# Patient Record
Sex: Male | Born: 1980 | Race: Black or African American | Hispanic: No | Marital: Single | Smoking: Never smoker
Health system: Southern US, Community
[De-identification: ages and names within clinical notes are randomized; demographics above are authoritative.]

## PROBLEM LIST (undated history)

## (undated) DIAGNOSIS — I10 Essential (primary) hypertension: Secondary | ICD-10-CM

## (undated) HISTORY — PX: DUODENOTOMY: SHX5801

## (undated) HISTORY — PX: ABDOMINAL SURGERY: SHX537

## (undated) HISTORY — PX: COSMETIC SURGERY: SHX468

---

## 2007-03-27 ENCOUNTER — Encounter: Admission: RE | Admit: 2007-03-27 | Discharge: 2007-03-27 | Payer: Self-pay | Admitting: Family Medicine

## 2011-06-06 ENCOUNTER — Emergency Department (INDEPENDENT_AMBULATORY_CARE_PROVIDER_SITE_OTHER): Payer: Self-pay

## 2011-06-06 ENCOUNTER — Emergency Department (HOSPITAL_BASED_OUTPATIENT_CLINIC_OR_DEPARTMENT_OTHER)
Admission: EM | Admit: 2011-06-06 | Discharge: 2011-06-06 | Disposition: A | Discharge status: Home / Self Care | Payer: Self-pay | Attending: Emergency Medicine | Admitting: Emergency Medicine

## 2011-06-06 ENCOUNTER — Encounter (HOSPITAL_BASED_OUTPATIENT_CLINIC_OR_DEPARTMENT_OTHER): Payer: Self-pay | Admitting: Emergency Medicine

## 2011-06-06 DIAGNOSIS — R05 Cough: Secondary | ICD-10-CM

## 2011-06-06 DIAGNOSIS — E669 Obesity, unspecified: Secondary | ICD-10-CM | POA: Insufficient documentation

## 2011-06-06 DIAGNOSIS — I517 Cardiomegaly: Secondary | ICD-10-CM

## 2011-06-06 DIAGNOSIS — IMO0001 Reserved for inherently not codable concepts without codable children: Secondary | ICD-10-CM | POA: Insufficient documentation

## 2011-06-06 DIAGNOSIS — R059 Cough, unspecified: Secondary | ICD-10-CM | POA: Insufficient documentation

## 2011-06-06 DIAGNOSIS — J069 Acute upper respiratory infection, unspecified: Secondary | ICD-10-CM | POA: Insufficient documentation

## 2011-06-06 DIAGNOSIS — Z79899 Other long term (current) drug therapy: Secondary | ICD-10-CM | POA: Insufficient documentation

## 2011-06-06 DIAGNOSIS — R609 Edema, unspecified: Secondary | ICD-10-CM | POA: Insufficient documentation

## 2011-06-06 DIAGNOSIS — I10 Essential (primary) hypertension: Secondary | ICD-10-CM | POA: Insufficient documentation

## 2011-06-06 DIAGNOSIS — R062 Wheezing: Secondary | ICD-10-CM | POA: Insufficient documentation

## 2011-06-06 DIAGNOSIS — R0989 Other specified symptoms and signs involving the circulatory and respiratory systems: Secondary | ICD-10-CM

## 2011-06-06 DIAGNOSIS — R0602 Shortness of breath: Secondary | ICD-10-CM | POA: Insufficient documentation

## 2011-06-06 HISTORY — DX: Essential (primary) hypertension: I10

## 2011-06-06 MED ORDER — ALBUTEROL SULFATE HFA 108 (90 BASE) MCG/ACT IN AERS
2.0000 | INHALATION_SPRAY | RESPIRATORY_TRACT | Status: DC | PRN
  Administered 2011-06-06: 2 via RESPIRATORY_TRACT
  Filled 2011-06-06: qty 6.7

## 2011-06-06 MED ORDER — OXYCODONE-ACETAMINOPHEN 5-325 MG PO TABS: 1.0000 | ORAL_TABLET | Freq: Once | ORAL | Status: DC

## 2011-06-06 MED ORDER — HYDROCODONE-ACETAMINOPHEN 5-325 MG PO TABS: 2.0000 | ORAL_TABLET | ORAL | Status: AC | PRN

## 2011-06-06 NOTE — ED Notes (Signed)
Pt c/o nasal and chest congestion x 3 days; Mucinex not helping; started Doxycycline yesterday

## 2011-06-06 NOTE — ED Provider Notes (Signed)
History     CSN: [id number]  Arrival date & time 06/06/11  4098   First MD Initiated Contact with Patient 06/06/11 [phone]      Chief Complaint  Patient presents with  . URI    (Consider location/radiation/quality/duration/timing/severity/associated sxs/prior treatment) Patient is a 31 y.o. male presenting with URI. The history is provided by the patient.  URI The primary symptoms include cough and wheezing. Primary symptoms do not include headaches, abdominal pain, nausea, vomiting or rash.   patient had a cough for the last 3 days. Some mild production. No fevers. He was started on doxycycline by a relative yesterday. He states had some wheezing. He states that he feels is starting to get better. No vomiting. No diarrhea. No headaches. He has had some mild myalgias. No unilateral leg swelling. No recent long distance travel. 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  Past Medical History  Diagnosis Date  . Hypertension     History reviewed. No pertinent past surgical history.  No family history on file.  History  Substance Use Topics  . Smoking status: Never Smoker   . Smokeless tobacco: Not on file  . Alcohol Use: No      Review of Systems  Constitutional: Negative for activity change and appetite change.  HENT: Negative for neck stiffness.   Eyes: Negative for pain.  Respiratory: Positive for cough, shortness of breath and wheezing. Negative for chest tightness.   Cardiovascular: Negative for chest pain and leg swelling.  Gastrointestinal: Negative for nausea, vomiting, abdominal pain and diarrhea.  Genitourinary: Negative for flank pain.  Musculoskeletal: Negative for back pain.  Skin: Negative for rash.  Neurological: Negative for weakness, numbness and headaches.  Psychiatric/Behavioral: Negative for behavioral problems.    Allergies  Review of patient's allergies indicates no known allergies.  Home Medications     Current Outpatient Rx  Name Route Sig Dispense Refill  . AMLODIPINE BESYLATE PO Oral Take by mouth.    Marland Kitchen HYDROCHLOROTHIAZIDE PO Oral Take by mouth.    Marland Kitchen HYDROCODONE-ACETAMINOPHEN 5-325 MG PO TABS Oral Take 2 tablets by mouth every 4 (four) hours as needed (cough). 10 tablet 0    BP 183/86  Pulse 79  Temp(Src) 98.4 F (36.9 C) (Oral)  Resp 28  SpO2 95%  Physical Exam  Nursing note and vitals reviewed. Constitutional: He is oriented to person, place, and time. He appears well-developed and well-nourished.       The patient is obese  HENT:  Head: Normocephalic and atraumatic.  Eyes: EOM are normal. Pupils are equal, round, and reactive to light.  Neck: Normal range of motion. Neck supple.  Cardiovascular: Normal rate, regular rhythm and normal heart sounds.   No murmur heard. Pulmonary/Chest: Effort normal and breath sounds normal.  Abdominal: Soft. Bowel sounds are normal. He exhibits no distension and no mass. There is no tenderness. There is no rebound and no guarding.  Musculoskeletal: Normal range of motion. He exhibits no edema.       Bilateral lower extremity pitting edema  Neurological: He is alert and oriented to person, place, and time. No cranial nerve deficit.  Skin: Skin is warm and dry.  Psychiatric: He has a normal mood and affect.    ED Course  Procedures (including critical care time)  Labs Reviewed - No data to display Dg Chest 2 View  06/06/2011  *RADIOLOGY REPORT*  Clinical Data: Cough, congestion  CHEST - 2 VIEW  Comparison: None.  Findings: Mildly enlarged heart silhouette.  There is a central venous congestion present. There is coarsening of the central bronchovascular markings.  No focal consolidation.  No pneumothorax. No acute osseous abnormality.  IMPRESSION: Cardiomegaly and central venous congestion.  Cannot exclude bronchitis.  Original Report Authenticated By: Genevive Bi, M.D.     1. URI (upper respiratory infection)       MDM  Cough  for the last 3 days. No relief Mucinex. Started on doxycycline by a physician relative. X-ray shows possible bronchitis and mild venous congestion. He has hypertension. He was given an inhaler to help with the symptoms. He'll be given some narcotic medicine to help with the cough. He'll followup as needed. He is going to continue to antibiotic that was started by his relative        Juliet Rude. Rubin Payor, MD 06/06/11 1054

## 2011-07-25 ENCOUNTER — Emergency Department (INDEPENDENT_AMBULATORY_CARE_PROVIDER_SITE_OTHER): Payer: No Typology Code available for payment source

## 2011-07-25 ENCOUNTER — Emergency Department (HOSPITAL_BASED_OUTPATIENT_CLINIC_OR_DEPARTMENT_OTHER)
Admission: EM | Admit: 2011-07-25 | Discharge: 2011-07-25 | Disposition: A | Discharge status: Home / Self Care | Payer: No Typology Code available for payment source | Attending: Emergency Medicine | Admitting: Emergency Medicine

## 2011-07-25 ENCOUNTER — Encounter (HOSPITAL_BASED_OUTPATIENT_CLINIC_OR_DEPARTMENT_OTHER): Payer: Self-pay | Admitting: *Deleted

## 2011-07-25 DIAGNOSIS — S60222A Contusion of left hand, initial encounter: Secondary | ICD-10-CM

## 2011-07-25 DIAGNOSIS — M7989 Other specified soft tissue disorders: Secondary | ICD-10-CM

## 2011-07-25 DIAGNOSIS — M79609 Pain in unspecified limb: Secondary | ICD-10-CM

## 2011-07-25 DIAGNOSIS — I10 Essential (primary) hypertension: Secondary | ICD-10-CM | POA: Insufficient documentation

## 2011-07-25 MED ORDER — HYDROCODONE-ACETAMINOPHEN 5-325 MG PO TABS: 2.0000 | ORAL_TABLET | ORAL | Status: AC | PRN

## 2011-07-25 MED ORDER — IBUPROFEN 800 MG PO TABS: 800.0000 mg | ORAL_TABLET | Freq: Three times a day (TID) | ORAL | Status: AC

## 2011-07-25 MED ORDER — IBUPROFEN 800 MG PO TABS: 800.0000 mg | ORAL_TABLET | Freq: Three times a day (TID) | ORAL | Status: DC
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
## 2011-07-25 NOTE — Discharge Instructions (Signed)
Contusion  A contusion is a deep bruise. Contusions happen when an injury causes bleeding under the skin. Signs of bruising include pain, puffiness (swelling), and discolored skin. The contusion may turn blue, purple, or yellow.  HOME CARE    Put ice on the injured area.   Put ice in a plastic bag.   Place a towel between your skin and the bag.   Leave the ice on for 15 to 20 minutes, 3 to 4 times a day.   Only take medicine as told by your doctor.   Rest the injured area.   If possible, raise (elevate) the injured area to lessen puffiness.  GET HELP RIGHT AWAY IF:    You have more bruising or puffiness.   You have pain that is getting worse.   Your puffiness or pain is not helped by medicine.  MAKE SURE YOU:    Understand these instructions.   Will watch your condition.   Will get help right away if you are not doing well or get worse.  Document Released: 09/08/2007 Document Revised: 03/11/2011 Document Reviewed: 01/25/2011  Exit[employer] Patient Information 2012 [employer].
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
## 2011-07-25 NOTE — ED Notes (Signed)
Upon entering room to complete discharge, there were 5 family members in the room, pt was asked to come to nurses station to sign for discharge paperwork as I could not get to the computer terminal.

## 2011-07-29 NOTE — ED Provider Notes (Signed)
Medical screening examination/treatment/procedure(s) were performed by non-physician practitioner and as supervising physician I was immediately available for consultation/collaboration.  Teyon Odette K Linker, MD 07/29/11 1119 

## 2012-11-03 ENCOUNTER — Encounter (HOSPITAL_COMMUNITY): Payer: Self-pay

## 2012-11-03 ENCOUNTER — Emergency Department (HOSPITAL_COMMUNITY)
Admission: EM | Admit: 2012-11-03 | Discharge: 2012-11-03 | Disposition: A | Discharge status: Home / Self Care | Payer: BC Managed Care – PPO | Attending: Emergency Medicine | Admitting: Emergency Medicine

## 2012-11-03 DIAGNOSIS — R51 Headache: Secondary | ICD-10-CM | POA: Insufficient documentation

## 2012-11-03 DIAGNOSIS — Z880 Allergy status to penicillin: Secondary | ICD-10-CM | POA: Insufficient documentation

## 2012-11-03 DIAGNOSIS — E669 Obesity, unspecified: Secondary | ICD-10-CM | POA: Insufficient documentation

## 2012-11-03 DIAGNOSIS — R002 Palpitations: Secondary | ICD-10-CM | POA: Insufficient documentation

## 2012-11-03 DIAGNOSIS — G252 Other specified forms of tremor: Secondary | ICD-10-CM | POA: Insufficient documentation

## 2012-11-03 DIAGNOSIS — R519 Headache, unspecified: Secondary | ICD-10-CM

## 2012-11-03 DIAGNOSIS — G250 Essential tremor: Secondary | ICD-10-CM | POA: Insufficient documentation

## 2012-11-03 DIAGNOSIS — E876 Hypokalemia: Secondary | ICD-10-CM

## 2012-11-03 DIAGNOSIS — Z79899 Other long term (current) drug therapy: Secondary | ICD-10-CM | POA: Insufficient documentation

## 2012-11-03 LAB — POCT I-STAT, CHEM 8
BUN: 12 mg/dL (ref 6–23)
Chloride: 101 mEq/L (ref 96–112)
Creatinine, Ser: 0.9 mg/dL (ref 0.50–1.35)
Potassium: 3.3 mEq/L — ABNORMAL LOW (ref 3.5–5.1)

## 2012-11-03 NOTE — ED Provider Notes (Signed)
CSN: [id number]     Arrival date & time 11/03/12  9629 History     First MD Initiated Contact with Patient 11/03/12 0243     Chief Complaint  Patient presents with  . Hypertension   HPI  History provided by the patient. Patient is a 32 year old male with history of hypertension and obesity who presents with complaints of not feeling well, jitteriness and slight headache. Patient states he was trying to fall sleep was having a difficult time and feeling of pressure in his head as well as feeling shaky and jittery and his body and extremities. Patient was concerned he may be having blood pressure issues. He does report taking a potassium supplement around 10:30 PM. Patient previously was on potassium regularly in conjunction with his hydrochlorothiazide blood pressure medicine but states he had not taken any potassium for quite some time and felt like taking it this evening. He otherwise felt well during the day. There was nothing else new or changed this evening. Denies any alcohol or drug use. Denies any excessive caffeine use. Symptoms are also associated with a pounding of the heart and slight palpitation. Denies any chest pain or shortness of breath. Symptoms are since improving. No other aggravating or alleviating factors. No other associated symptoms.    Past Medical History  Diagnosis Date  . Hypertension    History reviewed. No pertinent past surgical history. History reviewed. No pertinent family history. History  Substance Use Topics  . Smoking status: Never Smoker   . Smokeless tobacco: Not on file  . Alcohol Use: No    Review of Systems  Constitutional: Negative for fever, chills, diaphoresis and fatigue.  Eyes: Negative for visual disturbance.  Respiratory: Negative for shortness of breath.   Cardiovascular: Positive for palpitations. Negative for chest pain.  Gastrointestinal: Negative for nausea.  Neurological: Positive for tremors and headaches. Negative for weakness  and numbness.  Psychiatric/Behavioral: Negative for confusion.  All other systems reviewed and are negative.    Allergies  Penicillins  Home Medications   Current Outpatient Rx  Name  Route  Sig  Dispense  Refill  . albuterol (PROVENTIL HFA;VENTOLIN HFA) 108 (90 BASE) MCG/ACT inhaler   Inhalation   Inhale 2 puffs into the lungs every 6 (six) hours as needed. For shortness of breath or wheezing         . hydrochlorothiazide (HYDRODIURIL) 25 MG tablet   Oral   Take 25 mg by mouth daily.         Marland Kitchen olmesartan (BENICAR) 20 MG tablet   Oral   Take 20 mg by mouth daily.          BP 108/69  Pulse 65  Temp(Src) 98.7 F (37.1 C) (Oral)  Resp 18  Ht 5\' 10"  (1.778 m)  Wt 430 lb (195.047 kg)  BMI 61.7 kg/m2  SpO2 98% Physical Exam  Nursing note and vitals reviewed. Constitutional: He is oriented to person, place, and time. He appears well-developed and well-nourished.  Morbidly obese  HENT:  Head: Normocephalic.  Eyes: Conjunctivae and EOM are normal. Pupils are equal, round, and reactive to light.  No nystagmus  Cardiovascular: Normal rate and regular rhythm.   No murmur heard. Pulmonary/Chest: Effort normal and breath sounds normal. No respiratory distress. He has no wheezes. He has no rales.  Abdominal: Soft.  Musculoskeletal: Normal range of motion. He exhibits no edema and no tenderness.  Neurological: He is alert and oriented to person, place, and time. He has normal strength.  No cranial nerve deficit or sensory deficit. Gait normal.  Skin: Skin is warm.  Psychiatric: He has a normal mood and affect. His behavior is normal.    ED Course   Procedures   Results for orders placed during the hospital encounter of 11/03/12  POCT I-STAT, CHEM 8      Result Value Range   Sodium 140  135 - 145 mEq/L   Potassium 3.3 (*) 3.5 - 5.1 mEq/L   Chloride 101  96 - 112 mEq/L   BUN 12  6 - 23 mg/dL   Creatinine, Ser 1.61  0.50 - 1.35 mg/dL   Glucose, Bld 096 (*) 70 - 99  mg/dL   Calcium, Ion 0.45  4.09 - 1.23 mmol/L   TCO2 27  0 - 100 mmol/L   Hemoglobin 14.6  13.0 - 17.0 g/dL   HCT 81.1  91.4 - 78.2 %        1. Hypokalemia   2. Headache       MDM  Patient seen and evaluated. Patient appears well in no acute distress. Reports symptoms are improving.  Patient continues to be improved. He is sleepy this morning but has no other complaints. No return of symptoms or any new symptoms. ECG without any acute concerning findings. Labs with very slight hypokalemia. Otherwise unremarkable. At this time patient is felt able to return home continue to monitor symptoms and followup with PCP.    Date: 11/03/2012  Rate: 63  Rhythm: normal sinus rhythm  QRS Axis: normal  Intervals: normal  ST/T Wave abnormalities: nonspecific T wave changes  Conduction Disutrbances:none  Narrative Interpretation:   Old EKG Reviewed: none available    Angus Seller, PA-C 11/03/12 0502

## 2012-11-03 NOTE — ED Notes (Signed)
Patient is alert and oriented x3.  He was given DC instructions and follow up visit instructions.  Patient gave verbal understanding.  He was DC ambulatory under his own power to home.  V/S stable.  He was not showing any signs of distress on DC 

## 2012-11-03 NOTE — ED Notes (Signed)
Pt is concerned that his blood pressure was elevated, he took a potassium supplement and then had trouble going to sleep, he states that it felt like his heart rate was elevated and he felt pressure in his head

## 2012-11-03 NOTE — ED Provider Notes (Signed)
Medical screening examination/treatment/procedure(s) were performed by non-physician practitioner and as supervising physician I was immediately available for consultation/collaboration.  Lyanne Co, MD 11/03/12 [phone]

## 2013-09-13 ENCOUNTER — Encounter (HOSPITAL_BASED_OUTPATIENT_CLINIC_OR_DEPARTMENT_OTHER): Payer: Self-pay | Admitting: Emergency Medicine

## 2013-09-13 ENCOUNTER — Emergency Department (HOSPITAL_BASED_OUTPATIENT_CLINIC_OR_DEPARTMENT_OTHER)
Admission: EM | Admit: 2013-09-13 | Discharge: 2013-09-13 | Disposition: A | Discharge status: Home / Self Care | Payer: BC Managed Care – PPO | Attending: Emergency Medicine | Admitting: Emergency Medicine

## 2013-09-13 DIAGNOSIS — I10 Essential (primary) hypertension: Secondary | ICD-10-CM | POA: Insufficient documentation

## 2013-09-13 DIAGNOSIS — H1132 Conjunctival hemorrhage, left eye: Secondary | ICD-10-CM

## 2013-09-13 DIAGNOSIS — Z880 Allergy status to penicillin: Secondary | ICD-10-CM | POA: Insufficient documentation

## 2013-09-13 DIAGNOSIS — H1130 Conjunctival hemorrhage, unspecified eye: Secondary | ICD-10-CM | POA: Insufficient documentation

## 2013-09-13 DIAGNOSIS — Z79899 Other long term (current) drug therapy: Secondary | ICD-10-CM | POA: Insufficient documentation

## 2013-09-13 MED ORDER — FLUORESCEIN SODIUM 1 MG OP STRP
ORAL_STRIP | OPHTHALMIC | Status: AC
  Filled 2013-09-13: qty 1

## 2013-09-13 MED ORDER — FLUORESCEIN SODIUM 1 MG OP STRP: 1.0000 | ORAL_STRIP | Freq: Once | OPHTHALMIC | Status: DC

## 2013-09-13 MED ORDER — TETRACAINE HCL 0.5 % OP SOLN: 1.0000 [drp] | Freq: Once | OPHTHALMIC | Status: DC

## 2013-09-13 MED ORDER — TETRACAINE HCL 0.5 % OP SOLN
OPHTHALMIC | Status: AC
  Filled 2013-09-13: qty 2

## 2013-09-13 MED ORDER — POLYMYXIN B-TRIMETHOPRIM 10000-0.1 UNIT/ML-% OP SOLN: 1.0000 [drp] | OPHTHALMIC | Status: DC

## 2013-09-13 NOTE — Discharge Instructions (Signed)
Subconjunctival Hemorrhage °A subconjunctival hemorrhage is a bright red patch covering a portion of the white of the eye. The white part of the eye is called the sclera, and it is covered by a thin membrane called the conjunctiva. This membrane is clear, except for tiny blood vessels that you can see with the naked eye. When your eye is irritated or inflamed and becomes red, it is because the vessels in the conjunctiva are swollen. °Sometimes, a blood vessel in the conjunctiva can break and bleed. When this occurs, the blood builds up between the conjunctiva and the sclera, and spreads out to create a red area. The red spot may be very small at first. It may then spread to cover a larger part of the surface of the eye, or even all of the visible white part of the eye. °In almost all cases, the blood will go away and the eye will become white again. Before completely dissolving, however, the red area may spread. It may also become brownish-yellow in color, before going away. If a lot of blood collects under the conjunctiva, it may look like a bulge on the surface of the eye. This looks scary, but it will also eventually flatten out and go away. Subconjunctival hemorrhages do not cause pain, but if swollen, may cause a feeling of irritation. There is no effect on vision.  °CAUSES  °· The most common cause is mild trauma (rubbing the eye, irritation). °· Subconjunctival hemorrhages can happen because of coughing or straining (lifting heavy objects), vomiting, or sneezing. °· In some cases, your doctor may want to check your blood pressure. High blood pressure can also cause a sunconjunctival hemorrhage. °· Severe trauma or blunt injuries. °· Diseases that affect blood clotting (hemophilia, leukemia). °· Abnormalities of blood vessels behind the eye (carotid cavernous sinus fistula). °· Tumors behind the eye. °· Certain drugs (aspirin, coumadin, heparin). °· Recent eye surgery. °HOME CARE INSTRUCTIONS  °· Do not worry  about the appearance of your eye. You may continue your usual activities. °· Often, follow-up is not necessary. °SEEK MEDICAL CARE IF:  °· Your eye becomes painful. °· The bleeding does not disappear within 3 weeks. °· Bleeding occurs elsewhere, for example, under the skin, in the mouth, or in the other eye. °· You have recurring subconjunctival hemorrhages. °SEEK IMMEDIATE MEDICAL CARE IF:  °· Your vision changes or you have difficulty seeing. °· You develop severe headache, persistent vomiting, confusion, or abnormal drowsiness (lethargy). °· Your eye seems to bulge or protrude from the eye socket. °· You notice the sudden appearance of bruises, or have spontaneous bleeding elsewhere on your body. °Document Released: 03/22/2005 Document Revised: 06/14/2011 Document Reviewed: 02/17/2009 °Exit[employer]® Patient Information ©2014 [employer]. ° °

## 2013-09-13 NOTE — ED Notes (Signed)
Pt amb to room 6 with quick steady gait in nad. Pt reports "something flew into my eye last night while I was out taking a walk..." pt states he rubbed his eye, has had burning, blurred vision and redness since.

## 2013-09-13 NOTE — ED Provider Notes (Signed)
CSN: [id number]     Arrival date & time 09/13/13  9758 History   First MD Initiated Contact with Patient 09/13/13 1014     Chief Complaint  Patient presents with  . Eye Problem     (Consider location/radiation/quality/duration/timing/severity/associated sxs/prior Treatment) Patient is a 33 y.o. male presenting with eye problem. The history is provided by the patient.  Eye Problem Location:  L eye Quality:  Tearing Severity:  Moderate Onset quality:  Sudden Duration:  12 hours Timing:  Constant Progression:  Unchanged Chronicity:  New Context comment:  Was walking and a bug flew in his eye.  it was immediately red but some swelling today with watering.  minimal pain Relieved by:  None tried Worsened by:  Nothing tried Ineffective treatments:  None tried Associated symptoms: redness, swelling and tearing   Associated symptoms: no blurred vision, no crusting and no decreased vision     Past Medical History  Diagnosis Date  . Hypertension    History reviewed. No pertinent past surgical history. History reviewed. No pertinent family history. History  Substance Use Topics  . Smoking status: Never Smoker   . Smokeless tobacco: Not on file  . Alcohol Use: No    Review of Systems  Eyes: Positive for redness. Negative for blurred vision.  All other systems reviewed and are negative.     Allergies  Penicillins  Home Medications   Prior to Admission medications   Medication Sig Start Date End Date Taking? Authorizing Provider  albuterol (PROVENTIL HFA;VENTOLIN HFA) 108 (90 BASE) MCG/ACT inhaler Inhale 2 puffs into the lungs every 6 (six) hours as needed. For shortness of breath or wheezing    Historical Provider, MD  lisinopril-hydrochlorothiazide (PRINZIDE,ZESTORETIC) 20-25 MG per tablet Take 1 tablet by mouth daily.    Historical Provider, MD   BP 135/87  Pulse 77  Temp(Src) 99.4 F (37.4 C) (Oral)  Resp 16  Ht 5\' 10"  (1.778 m)  Wt 400 lb (181.439 kg)  BMI 57.39  kg/m2  SpO2 96% Physical Exam  Nursing note and vitals reviewed. Constitutional: He is oriented to person, place, and time. He appears well-developed and well-nourished. No distress.  HENT:  Head: Normocephalic and atraumatic.  Mouth/Throat: Oropharynx is clear and moist.  Eyes: EOM are normal. Pupils are equal, round, and reactive to light. Left conjunctiva has a hemorrhage.  Mild left upper lid swelling and complete subconjunctival hemorrhage  Neck: Normal range of motion. Neck supple.  Cardiovascular: Normal rate.   Pulmonary/Chest: Effort normal.  Abdominal: Soft. He exhibits no distension. There is no tenderness. There is no rebound and no guarding.  Musculoskeletal: Normal range of motion. He exhibits no edema and no tenderness.  Neurological: He is alert and oriented to person, place, and time.  Skin: Skin is warm and dry. No rash noted. No erythema.  Psychiatric: He has a normal mood and affect. His behavior is normal.    ED Course  Procedures (including critical care time) Labs Review Labs Reviewed - No data to display  Imaging Review No results found.   EKG Interpretation None      MDM   Final diagnoses:  Subconjunctival hemorrhage of left eye    Patient hit in the left eye by a bug while walking last night with evidence of left subconjunctival hemorrhage without vision changes or evidence of corneal abrasion.    Gwyneth Sprout, MD 09/13/13 1109

## 2014-09-06 ENCOUNTER — Other Ambulatory Visit: Payer: Self-pay | Admitting: Surgical Oncology

## 2014-09-06 DIAGNOSIS — K219 Gastro-esophageal reflux disease without esophagitis: Secondary | ICD-10-CM

## 2016-02-17 ENCOUNTER — Emergency Department (HOSPITAL_COMMUNITY)
Admission: EM | Admit: 2016-02-17 | Discharge: 2016-02-17 | Disposition: A | Discharge status: Home / Self Care | Payer: Self-pay | Attending: Emergency Medicine | Admitting: Emergency Medicine

## 2016-02-17 ENCOUNTER — Encounter (HOSPITAL_COMMUNITY): Payer: Self-pay

## 2016-02-17 ENCOUNTER — Emergency Department (HOSPITAL_COMMUNITY): Payer: Self-pay

## 2016-02-17 DIAGNOSIS — R1013 Epigastric pain: Secondary | ICD-10-CM

## 2016-02-17 DIAGNOSIS — I10 Essential (primary) hypertension: Secondary | ICD-10-CM | POA: Insufficient documentation

## 2016-02-17 DIAGNOSIS — Z79899 Other long term (current) drug therapy: Secondary | ICD-10-CM | POA: Insufficient documentation

## 2016-02-17 DIAGNOSIS — K5900 Constipation, unspecified: Secondary | ICD-10-CM | POA: Insufficient documentation

## 2016-02-17 DIAGNOSIS — R1033 Periumbilical pain: Secondary | ICD-10-CM | POA: Insufficient documentation

## 2016-02-17 LAB — URINALYSIS, ROUTINE W REFLEX MICROSCOPIC
Bilirubin Urine: NEGATIVE
GLUCOSE, UA: NEGATIVE mg/dL
HGB URINE DIPSTICK: NEGATIVE
KETONES UR: 15 mg/dL — AB
Leukocytes, UA: NEGATIVE
Nitrite: NEGATIVE
PH: 6 (ref 5.0–8.0)
PROTEIN: NEGATIVE mg/dL
Specific Gravity, Urine: 1.025 (ref 1.005–1.030)

## 2016-02-17 LAB — COMPREHENSIVE METABOLIC PANEL
ALBUMIN: 4.8 g/dL (ref 3.5–5.0)
ALT: 67 U/L — AB (ref 17–63)
AST: 33 U/L (ref 15–41)
Alkaline Phosphatase: 60 U/L (ref 38–126)
Anion gap: 8 (ref 5–15)
BUN: 11 mg/dL (ref 6–20)
CHLORIDE: 106 mmol/L (ref 101–111)
CO2: 26 mmol/L (ref 22–32)
CREATININE: 0.79 mg/dL (ref 0.61–1.24)
Calcium: 9.4 mg/dL (ref 8.9–10.3)
GFR calc Af Amer: >60 mL/min (ref >60)
GLUCOSE: 96 mg/dL (ref 65–99)
Potassium: 3.3 mmol/L — ABNORMAL LOW (ref 3.5–5.1)
Sodium: 140 mmol/L (ref 135–145)
Total Bilirubin: 0.8 mg/dL (ref 0.3–1.2)
Total Protein: 8.1 g/dL (ref 6.5–8.1)

## 2016-02-17 LAB — CBC WITH DIFFERENTIAL/PLATELET
Basophils Absolute: 0 10*3/uL (ref 0.0–0.1)
Basophils Relative: 1 %
EOS PCT: 1 %
Eosinophils Absolute: 0.1 10*3/uL (ref 0.0–0.7)
HEMATOCRIT: 49.7 % (ref 39.0–52.0)
Hemoglobin: 16.5 g/dL (ref 13.0–17.0)
LYMPHS ABS: 1.3 10*3/uL (ref 0.7–4.0)
LYMPHS PCT: 29 %
MCH: 30.3 pg (ref 26.0–34.0)
MCHC: 33.2 g/dL (ref 30.0–36.0)
MCV: 91.4 fL (ref 78.0–100.0)
MONO ABS: 0.3 10*3/uL (ref 0.1–1.0)
MONOS PCT: 7 %
Neutro Abs: 2.7 10*3/uL (ref 1.7–7.7)
Neutrophils Relative %: 62 %
PLATELETS: 233 10*3/uL (ref 150–400)
RBC: 5.44 MIL/uL (ref 4.22–5.81)
RDW: 13.8 % (ref 11.5–15.5)
WBC: 4.3 10*3/uL (ref 4.0–10.5)

## 2016-02-17 LAB — I-STAT CG4 LACTIC ACID, ED: Lactic Acid, Venous: 0.76 mmol/L (ref 0.5–1.9)

## 2016-02-17 LAB — LIPASE, BLOOD: LIPASE: 18 U/L (ref 11–51)

## 2016-02-17 MED ORDER — IOPAMIDOL (ISOVUE-300) INJECTION 61%
100.0000 mL | Freq: Once | INTRAVENOUS | Status: AC | PRN
  Administered 2016-02-17: 100 mL via INTRAVENOUS

## 2016-02-17 MED ORDER — MORPHINE SULFATE (PF) 4 MG/ML IV SOLN
4.0000 mg | Freq: Once | INTRAVENOUS | Status: AC
  Administered 2016-02-17: 4 mg via INTRAVENOUS
  Filled 2016-02-17: qty 1

## 2016-02-17 MED ORDER — ONDANSETRON HCL 4 MG/2ML IJ SOLN
4.0000 mg | Freq: Once | INTRAMUSCULAR | Status: AC
  Administered 2016-02-17: 4 mg via INTRAVENOUS
  Filled 2016-02-17: qty 2

## 2016-02-17 MED ORDER — POLYETHYLENE GLYCOL 3350 17 GM/SCOOP PO POWD: 17.0000 g | Freq: Two times a day (BID) | ORAL | 0 refills | Status: AC

## 2016-02-17 MED ORDER — SODIUM CHLORIDE 0.9 % IV BOLUS (SEPSIS)
1000.0000 mL | Freq: Once | INTRAVENOUS | Status: AC
  Administered 2016-02-17: 1000 mL via INTRAVENOUS

## 2016-02-17 MED ORDER — OMEPRAZOLE 20 MG PO CPDR: 20.0000 mg | DELAYED_RELEASE_CAPSULE | Freq: Every day | ORAL | 0 refills | Status: AC

## 2016-02-17 NOTE — ED Notes (Signed)
Patient transported to CT 

## 2016-02-17 NOTE — ED Triage Notes (Signed)
Pt complains of severe abdominal pain for the last hour, he had gastric surgery one year ago and he says that he ate chili last night and thinks the beans were too much, no vomiting but he's had diarrhea a few times

## 2016-02-17 NOTE — Progress Notes (Signed)
CM spoke with pt who confirms uninsured [place] resident with no pcp.  CM discussed and provided written information to assist pt with determining choice for uninsured accepting pcps, discussed the importance of pcp vs EDP services for f/u care, www.needymeds.org, www.goodrx.com, discounted pharmacies and other [place] resources such as [facility] , [facility], affordable care act, financial assistance, uninsured dental services, [facility] [facility], DSS and  health department  Reviewed resources for [place] uninsured accepting pcps like [facility], [facility] [address], [facility], [facility], local urgent care centers, [facility], [facility], general medical clinics, [facility], [facility] plus others, medication resources, [facility] out [facility]ies and housing Pt voiced understanding and appreciation of resources provided   Provided [facility] contact information Pt seen by Stacey of [facility] prior to leaving [facility] ED   

## 2016-02-17 NOTE — ED Provider Notes (Signed)
[facility]-EMERGENCY DEPT Provider Note   CSN: [id number] Arrival date & time: 02/17/16  [id number]     History   Chief Complaint Chief Complaint  Patient presents with  . Abdominal Pain    HPI Brett Cannon is a 35 y.o. male.  Brett Cannon is a 35 y.o. Male with a history of a duodenal switch who presents to the emergency department complaining of gradual onset of epigastric abdominal pain that was severe beginning this morning. Patient reports he ate chili last night and had some gas pains. He reports gradual onset this morning of epigastric abdominal pain that was severe enough to bring him to the emergency department. He reports having 2 bowel movements this morning that were formed. No diarrhea. He reports nausea but no vomiting today. He has a history of a duodenal switch for morbid obesity by a surgeon at [facility]. No treatments prior to arrival today. Patient denies fevers, chest pain, shortness of breath, coughing, vomiting, diarrhea, hematochezia, urinary symptoms or rashes.   The history is provided by the patient. No language interpreter was used.    Past Medical History:  Diagnosis Date  . Hypertension     There are no active problems to display for this patient.   Past Surgical History:  Procedure Laterality Date  . DUODENOTOMY         Home Medications    Prior to Admission medications   Medication Sig Start Date End Date Taking? Authorizing Provider  lisinopril (PRINIVIL,ZESTRIL) 5 MG tablet Take 5 mg by mouth daily.   Yes Historical Provider, MD  Multiple Vitamins-Minerals (MULTIVITAMIN ADULT) TABS Take 1 tablet by mouth daily.   Yes Historical Provider, MD  omeprazole (PRILOSEC) 20 MG capsule Take 1 capsule (20 mg total) by mouth daily. 02/17/16   Everlene FarrierWilliam Luisdaniel Kenton, PA-C  polyethylene glycol powder (GLYCOLAX/MIRALAX) powder Take 17 g by mouth 2 (two) times daily. 02/17/16   Everlene FarrierWilliam Ajee Heasley, PA-C    Family History History reviewed. No pertinent family  history.  Social History Social History  Substance Use Topics  . Smoking status: Never Smoker  . Smokeless tobacco: Never Used  . Alcohol use No     Allergies   Penicillins and Pineapple   Review of Systems Review of Systems  Constitutional: Negative for chills and fever.  HENT: Negative for congestion and sore throat.   Eyes: Negative for visual disturbance.  Respiratory: Negative for cough and shortness of breath.   Cardiovascular: Negative for chest pain.  Gastrointestinal: Positive for abdominal pain and nausea. Negative for diarrhea and vomiting.  Genitourinary: Negative for difficulty urinating, dysuria, frequency, hematuria and urgency.  Musculoskeletal: Negative for back pain and neck pain.  Skin: Negative for rash.  Neurological: Negative for headaches.     Physical Exam Updated Vital Signs BP 120/74 (BP Location: Left Arm)   Pulse 72   Temp 97.6 F (36.4 C) (Oral)   Resp 18   Ht 5\' 10"  (1.778 m)   Wt 113.4 kg   SpO2 100%   BMI 35.87 kg/m   Physical Exam  Constitutional: He appears well-developed and well-nourished. No distress.  Nontoxic-appearing. Morbidly obese.  HENT:  Head: Normocephalic and atraumatic.  Mouth/Throat: Oropharynx is clear and moist.  Eyes: Conjunctivae are normal. Pupils are equal, round, and reactive to light. Right eye exhibits no discharge. Left eye exhibits no discharge.  Neck: Neck supple.  Cardiovascular: Normal rate, regular rhythm, normal heart sounds and intact distal pulses.  Exam reveals no gallop and no friction rub.  No murmur heard. Pulmonary/Chest: Effort normal and breath sounds normal. No respiratory distress.  Abdominal: Soft. Bowel sounds are normal. He exhibits no distension and no mass. There is tenderness. There is guarding.  Abdomen is soft. Bowel sounds are present. Patient has epigastric abdominal tenderness to palpation with guarding. No lower abdominal tenderness.  Musculoskeletal: He exhibits no edema.    Lymphadenopathy:    He has no cervical adenopathy.  Neurological: He is alert. Coordination normal.  Skin: Skin is warm and dry. Capillary refill takes less than 2 seconds. No rash noted. He is not diaphoretic. No erythema. No pallor.  Psychiatric: He has a normal mood and affect. His behavior is normal.  Nursing note and vitals reviewed.    ED Treatments / Results  Labs (all labs ordered are listed, but only abnormal results are displayed) Labs Reviewed  COMPREHENSIVE METABOLIC PANEL - Abnormal; Notable for the following:       Result Value   Potassium 3.3 (*)    ALT 67 (*)    All other components within normal limits  URINALYSIS, ROUTINE W REFLEX MICROSCOPIC (NOT AT [facility]) - Abnormal; Notable for the following:    APPearance CLOUDY (*)    Ketones, ur 15 (*)    All other components within normal limits  LIPASE, BLOOD  CBC WITH DIFFERENTIAL/PLATELET  I-STAT CG4 LACTIC ACID, ED    EKG  EKG Interpretation None       Radiology Ct Abdomen Pelvis W Contrast  Result Date: 02/17/2016 CLINICAL DATA:  Acute epigastric abdominal pain. History of prior gastroduodenal surgery. EXAM: CT ABDOMEN AND PELVIS WITH CONTRAST TECHNIQUE: Multidetector CT imaging of the abdomen and pelvis was performed using the standard protocol following bolus administration of intravenous contrast. CONTRAST:  ISOVUE-300 IOPAMIDOL (ISOVUE-300) INJECTION 61% COMPARISON:  None. FINDINGS: Lower chest: The lung bases are clear of acute process. No pleural effusion or pulmonary lesions. The heart is normal in size. No pericardial effusion. The distal esophagus and aorta are unremarkable. Hepatobiliary: No focal hepatic lesions or intrahepatic biliary dilatation. The portal vein is patent. Gallbladder is unremarkable. No common bile duct dilatation. Pancreas: No mass, inflammation or ductal dilatation. Spleen: Normal size.  No focal lesions. Adrenals/Urinary Tract: The adrenal glands and kidneys are unremarkable.  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 No complicating features are demonstrated. The distal small bowel is slightly dilated and has the stool appearance. This could be related to the patient's bypass surgery. I do not see a definite small bowel obstruction. There is moderate stool throughout the colon and down into the rectum. The terminal ileum is unremarkable. The appendix is normal. Vascular/Lymphatic: The aorta and branch vessels are patent. The major venous structures are patent. Prominent vessels in the root of the small bowel mesenteric a but no findings for venous thrombosis. Small scattered mesenteric and retroperitoneal lymph nodes but no mass or adenopathy. Mild diffuse mesenteric edema is noted along with small amount of free pelvic fluid. There is also diffuse body wall edema. Reproductive: The prostate gland and seminal vesicles are unremarkable. Other: No pelvic mass or adenopathy. No free pelvic fluid collections. No inguinal mass or adenopathy. No inguinal hernia. Musculoskeletal: No significant bony findings. IMPRESSION: 1. 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 No complicating features are identified. 2. Mildly dilated distal small bowel containing stool. However, no findings to suggest obstruction. This could be a function of the patient's bowel surgery. 3. Mesenteric edema, diffuse body wall edema and a small amount of free pelvic fluid.  Electronically Signed   By: Rudie MeyerP.  Gallerani M.D.   On: 02/17/2016 09:59    Procedures Procedures (including critical care time)  Medications Ordered in ED Medications  sodium chloride 0.9 % bolus 1,000 mL (0 mLs Intravenous Stopped 02/17/16 0944)  ondansetron (ZOFRAN) injection 4 mg (4 mg Intravenous Given 02/17/16 0844)  morphine 4 MG/ML injection 4 mg (4 mg Intravenous Given 02/17/16 0845)  iopamidol (ISOVUE-300) 61 % injection 100 mL (100 mLs Intravenous Contrast Given 02/17/16 0936)     Initial Impression /  Assessment and Plan / ED Course  I have reviewed the triage vital signs and the nursing notes.  Pertinent labs & imaging results that were available during my care of the patient were reviewed by me and considered in my medical decision making (see chart for details).  Clinical Course    This is a 35 y.o. Male with a history of a duodenal switch who presents to the emergency department complaining of gradual onset of epigastric abdominal pain that was severe beginning this morning. Patient reports he ate chili last night and had some gas pains. He reports gradual onset this morning of epigastric abdominal pain that was severe enough to bring him to the emergency department. He reports having 2 bowel movements this morning that were formed. No diarrhea. He reports nausea but no vomiting today. He has a history of a duodenal switch for morbid obesity by a surgeon at [facility]. No treatments prior to arrival today. He does tell me he had 2 bowel movements today and had to strain to have a bowel movement. On exam the patient is afebrile nontoxic appearing. He has epigastric abdominal tenderness to palpation with guarding. Abdomen is soft. Bowel sounds are present.  CBC and lipase are within normal limits. CMP is remarkable only for mildly elevated ALT at 67. Urinalysis shows no sign of infection. CT abdomen and pelvis shows surgical changes related to gastric bypass but no copy getting features identified. It does show dilated distal small bowel containing stool. No evidence of obstruction. At recheck patient reports his pain and nausea has completely resolved. He is tolerating by mouth. He does tell me that he often has constipation and strains to stool. He tells me he was prescribed MiraLAX by his surgeon but never took it. He is going to try taking this at home. We'll also start him on a omeprazole. He denies taking any acid reflux medicines currently. Will prescribe a short course of omeprazole for the  patient. Patient tells me he thinks his pain medicine from gas. His abdomen is soft and nontender prior to discharge. I discussed strict and specific return precautions with the patient. I advised the patient to follow-up with their primary care provider this week. I advised the patient to return to the emergency department with new or worsening symptoms or new concerns. The patient verbalized understanding and agreement with plan.      Final Clinical Impressions(s) / ED Diagnoses   Final diagnoses:  Constipation, unspecified constipation type  Epigastric abdominal pain    New Prescriptions Discharge Medication List as of 02/17/2016 11:28 AM    START taking these medications   Details  omeprazole (PRILOSEC) 20 MG capsule Take 1 capsule (20 mg total) by mouth daily., Starting Tue 02/17/2016, Print    polyethylene glycol powder (GLYCOLAX/MIRALAX) powder Take 17 g by mouth 2 (two) times daily., Starting Tue 02/17/2016, Print         Everlene FarrierWilliam Kadee Philyaw, PA-C 02/17/16 1212  Shaune Pollack, MD 02/18/16 [phone]

## 2017-11-23 ENCOUNTER — Telehealth (INDEPENDENT_AMBULATORY_CARE_PROVIDER_SITE_OTHER): Payer: Self-pay | Admitting: Orthopedic Surgery

## 2017-11-23 NOTE — Telephone Encounter (Signed)
Patient called in regard t compression hose. Stated he has been here sometime ago. Offered patient an appointment he declined. Wanted to speak to [place] first.  [phone]

## 2017-11-29 NOTE — Telephone Encounter (Signed)
I have spoken to Brett Cannon and he is wanting to make an appt for eval of possible varicose veins. He was seen several years ago and is wanting Dr. Lajoyce Cornersuda to make sure he is ok. Advised he would like an appt first week of October on Wednesday. Can you please call and tell him I asked you to make appt.

## 2018-01-02 ENCOUNTER — Ambulatory Visit (INDEPENDENT_AMBULATORY_CARE_PROVIDER_SITE_OTHER): Payer: Self-pay | Admitting: Orthopedic Surgery

## 2019-04-14 ENCOUNTER — Emergency Department (HOSPITAL_BASED_OUTPATIENT_CLINIC_OR_DEPARTMENT_OTHER)
Admission: EM | Admit: 2019-04-14 | Discharge: 2019-04-14 | Disposition: A | Discharge status: Home / Self Care | Payer: Self-pay | Attending: Emergency Medicine | Admitting: Emergency Medicine

## 2019-04-14 ENCOUNTER — Encounter (HOSPITAL_BASED_OUTPATIENT_CLINIC_OR_DEPARTMENT_OTHER): Payer: Self-pay

## 2019-04-14 ENCOUNTER — Emergency Department (HOSPITAL_BASED_OUTPATIENT_CLINIC_OR_DEPARTMENT_OTHER): Payer: Self-pay

## 2019-04-14 ENCOUNTER — Other Ambulatory Visit: Payer: Self-pay

## 2019-04-14 DIAGNOSIS — Z79899 Other long term (current) drug therapy: Secondary | ICD-10-CM | POA: Insufficient documentation

## 2019-04-14 DIAGNOSIS — U071 COVID-19: Secondary | ICD-10-CM | POA: Insufficient documentation

## 2019-04-14 DIAGNOSIS — J988 Other specified respiratory disorders: Secondary | ICD-10-CM | POA: Insufficient documentation

## 2019-04-14 DIAGNOSIS — I10 Essential (primary) hypertension: Secondary | ICD-10-CM | POA: Insufficient documentation

## 2019-04-14 NOTE — Discharge Instructions (Signed)
Please continue to quarantine at home and monitor your symptoms closely. You chest x-ray was clear. Antibiotics are not helpful in treating viral infection, the virus should run its course in about 10-14 days. Please make sure you are drinking plenty of fluids. You can treat your symptoms supportively with tylenol for fevers and pains, and mucinex and throat lozenges to help with cough. If your symptoms are not improving please follow up with you Primary doctor.   If you develop persistent fevers, shortness of breath or difficulty breathing, chest pain, severe headache and neck pain, persistent nausea and vomiting or other new or concerning symptoms return to the Emergency department.

## 2019-04-14 NOTE — ED Provider Notes (Signed)
[facility] EMERGENCY DEPARTMENT Provider Note   CSN: [id number] Arrival date & time: 04/14/19  6283     History Chief Complaint  Patient presents with  . Nasal Congestion    Brett Cannon is a 39 y.o. male.  Brett Cannon is a 39 y.o. male with a history of hypertension, who presents with persistent Covid symptoms and congestion.  Patient states he was first diagnosed with Covid a week ago symptoms started on 12/28 and he was initially thought to have a sinus infection was treated with antibiotics.  Patient states overall his symptoms are improving he is continued to have some low-grade fevers of 99, and has congestion with continued cough he feels like congestion is moving into his chest and wants a chest x-ray to check out his lungs.  He states that he only has shortness of breath after significant activity or severe coughing spell.  Cough is primarily dry nonproductive.  He continues to have some nasal congestion and rhinorrhea.  No nausea, vomiting or diarrhea, no abdominal pain.  Denies headaches.  He continues to have some myalgias and fatigue.  He has been quarantining at home.  Patient mentioned in triage that he was concerned about his blood pressure being mildly elevated, states that he used to take lisinopril.  He is not having any chest pain, shortness of breath, lower extremity swelling, headaches or vision changes.        Past Medical History:  Diagnosis Date  . Hypertension     There are no problems to display for this patient.   Past Surgical History:  Procedure Laterality Date  . DUODENOTOMY         No family history on file.  Social History   Tobacco Use  . Smoking status: Never Smoker  . Smokeless tobacco: Never Used  Substance Use Topics  . Alcohol use: No  . Drug use: No    Home Medications Prior to Admission medications   Medication Sig Start Date End Date Taking? Authorizing Provider  lisinopril (PRINIVIL,ZESTRIL) 5 MG tablet  Take 5 mg by mouth daily.    [provider]  Multiple Vitamins-Minerals (MULTIVITAMIN ADULT) TABS Take 1 tablet by mouth daily.    [provider]  omeprazole (PRILOSEC) 20 MG capsule Take 1 capsule (20 mg total) by mouth daily. 02/17/16   Everlene Farrier, PA-C  polyethylene glycol powder (GLYCOLAX/MIRALAX) powder Take 17 g by mouth 2 (two) times daily. 02/17/16   Everlene Farrier, PA-C    Allergies    Penicillins and Pineapple  Review of Systems   Review of Systems  Constitutional: Positive for chills and fever.  HENT: Positive for congestion and rhinorrhea. Negative for sore throat.   Respiratory: Positive for cough. Negative for shortness of breath.   Cardiovascular: Negative for chest pain.  Gastrointestinal: Negative for abdominal pain, diarrhea, nausea and vomiting.  Musculoskeletal: Positive for myalgias.  Skin: Negative for color change and rash.  Neurological: Negative for headaches.  All other systems reviewed and are negative.   Physical Exam Updated Vital Signs BP (!) 147/92 (BP Location: Right Arm)   Pulse (!) 54   Temp 98.3 F (36.8 C) (Oral)   Resp 20   Ht 5\' 10"  (1.778 m)   Wt 122.5 kg   SpO2 100%   BMI 38.74 kg/m   Physical Exam Vitals and nursing note reviewed.  Constitutional:      General: He is not in acute distress.    Appearance: Normal appearance. He is well-developed.  He is not ill-appearing or diaphoretic.  HENT:     Head: Normocephalic and atraumatic.     Nose: Congestion and rhinorrhea present.     Mouth/Throat:     Mouth: Mucous membranes are moist.     Pharynx: Oropharynx is clear.  Eyes:     General:        Right eye: No discharge.        Left eye: No discharge.     Pupils: Pupils are equal, round, and reactive to light.  Cardiovascular:     Rate and Rhythm: Normal rate and regular rhythm.     Heart sounds: Normal heart sounds.  Pulmonary:     Effort: Pulmonary effort is normal. No respiratory distress.      Breath sounds: Normal breath sounds. No wheezing or rales.     Comments: Respirations equal and unlabored, patient able to speak in full sentences, lungs clear to auscultation bilaterally Abdominal:     General: Bowel sounds are normal. There is no distension.     Palpations: Abdomen is soft. There is no mass.     Tenderness: There is no abdominal tenderness. There is no guarding.     Comments: Abdomen soft, nondistended, nontender to palpation in all quadrants without guarding or peritoneal signs  Musculoskeletal:        General: No deformity.     Cervical back: Neck supple.  Skin:    General: Skin is warm and dry.     Capillary Refill: Capillary refill takes less than 2 seconds.  Neurological:     Mental Status: He is alert.     Coordination: Coordination normal.     Comments: Speech is clear, able to follow commands Moves extremities without ataxia, coordination intact  Psychiatric:        Mood and Affect: Mood normal.        Behavior: Behavior normal.     ED Results / Procedures / Treatments   Labs (all labs ordered are listed, but only abnormal results are displayed) Labs Reviewed - No data to display  EKG None  Radiology DG Chest [facility] 1 View  Result Date: 04/14/2019 CLINICAL DATA:  Patient positive for COVID-19 1 week ago. No upper respiratory symptoms reported. Elevated blood pressure. EXAM: PORTABLE CHEST 1 VIEW COMPARISON:  June 06, 2011 FINDINGS: The cardiac size remains prominent but is poorly evaluated on this portable film. The hila and mediastinum are unchanged. No pneumothorax. No abnormalities identified in the lungs. IMPRESSION: No interval changes. Cardiac prominence is again identified but poorly evaluated based on portable imaging today. Electronically Signed   By: Dorise Bullion III M.D   On: 04/14/2019 10:22    Procedures Procedures (including critical care time)  Medications Ordered in ED Medications - No data to display  ED Course  I have reviewed  the triage vital signs and the nursing notes.  Pertinent labs & imaging results that were available during my care of the patient were reviewed by me and considered in my medical decision making (see chart for details).  Clinical Course as of Apr 14 1055  Sat Apr 14, 2019  1022 Chest x-ray is clear without evidence of infiltrate to suggest Covid pneumonia  DG Chest [facility] [KF]    Clinical Course User Index [KF] Janet Berlin   MDM Rules/Calculators/A&P                       39 year old male who was diagnosed  with Covid on 12/28 presents with continued symptoms, states that he has had continued cough and is concerned that congestion is getting into his chest, he states cough is occasionally productive of clear mucus.  He has not had any associated chest pain or shortness of breath he has O2 saturations of 100% on room air with no increased work of breathing and clear lungs.  His chest x-ray does not show any infiltrates or evidence of pneumonia.  Discussed with patient typical course of virus and symptomatic treatment at home.  Return precautions discussed.  Patient expresses understanding and agreement.  Discharged home in good condition.  Brett Cannon was evaluated in Emergency Department on 04/14/2019 for the symptoms described in the history of present illness. He was evaluated in the context of the global COVID-19 pandemic, which necessitated consideration that the patient might be at risk for infection with the SARS-CoV-2 virus that causes COVID-19. Institutional protocols and algorithms that pertain to the evaluation of patients at risk for COVID-19 are in a state of rapid change based on information released by regulatory bodies including the CDC and federal and state organizations. These policies and algorithms were followed during the patient's care in the ED.  Final Clinical Impression(s) / ED Diagnoses Final diagnoses:  Respiratory tract infection due to COVID-19 virus     Rx / DC Orders ED Discharge Orders    None       Dartha Lodge, [place] 04/14/19 1058    Tegeler, Canary Brim, MD 04/14/19 1130

## 2019-04-14 NOTE — ED Triage Notes (Signed)
Pt states that he has a positive covid test one week ago. Reports that his symptoms started on Dec. 28th, was initially treated as a sinus infection with antibiotics. Pt states "I want a chest X-Ray because of congestion and am concerned about my BP being 155/101"/ Pt reports that he has lisinopril that he use to be on prior to weight loss surgery so he took one PTA.

## 2019-04-14 NOTE — ED Notes (Signed)
ED Provider at bedside. 

## 2019-04-14 NOTE — ED Notes (Signed)
Pt verbalized understanding of dc instructions.

## 2019-05-09 ENCOUNTER — Other Ambulatory Visit: Payer: Self-pay | Admitting: Surgical Oncology

## 2019-10-26 ENCOUNTER — Other Ambulatory Visit: Payer: Self-pay

## 2019-10-26 ENCOUNTER — Emergency Department (HOSPITAL_BASED_OUTPATIENT_CLINIC_OR_DEPARTMENT_OTHER)
Admission: EM | Admit: 2019-10-26 | Discharge: 2019-10-27 | Disposition: A | Discharge status: Home / Self Care | Payer: BLUE CROSS/BLUE SHIELD | Attending: Emergency Medicine | Admitting: Emergency Medicine

## 2019-10-26 ENCOUNTER — Encounter (HOSPITAL_BASED_OUTPATIENT_CLINIC_OR_DEPARTMENT_OTHER): Payer: Self-pay

## 2019-10-26 DIAGNOSIS — I10 Essential (primary) hypertension: Secondary | ICD-10-CM | POA: Diagnosis not present

## 2019-10-26 DIAGNOSIS — X503XXA Overexertion from repetitive movements, initial encounter: Secondary | ICD-10-CM | POA: Diagnosis not present

## 2019-10-26 DIAGNOSIS — M79671 Pain in right foot: Secondary | ICD-10-CM | POA: Insufficient documentation

## 2019-10-26 DIAGNOSIS — Z79899 Other long term (current) drug therapy: Secondary | ICD-10-CM | POA: Diagnosis not present

## 2019-10-26 NOTE — ED Triage Notes (Signed)
Pt presents with R foot pain x 2 days after sitting on it wrong.

## 2019-10-27 ENCOUNTER — Emergency Department (HOSPITAL_BASED_OUTPATIENT_CLINIC_OR_DEPARTMENT_OTHER): Payer: BLUE CROSS/BLUE SHIELD

## 2019-10-27 MED ORDER — NAPROXEN 375 MG PO TABS: ORAL_TABLET | ORAL | 0 refills | Status: AC

## 2019-10-27 MED ORDER — NAPROXEN 250 MG PO TABS
500.0000 mg | ORAL_TABLET | Freq: Once | ORAL | Status: AC
  Administered 2019-10-27: 500 mg via ORAL
  Filled 2019-10-27: qty 2

## 2019-10-27 NOTE — ED Notes (Signed)
ED Provider at bedside. 

## 2019-10-27 NOTE — ED Provider Notes (Signed)
[facility] Provider Note: Lowella Dell, MD, FACEP  CSN: [id number] MRN: [id number] ARRIVAL: 10/26/19 at 2109 ROOM: [facility]13/[facility]13   CHIEF COMPLAINT  Foot Injury   HISTORY OF PRESENT ILLNESS  10/27/19 12:02 AM Brett Cannon is a 39 y.o. male who thinks he may have "sat wrong" on his right foot 3 days ago.  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  He has subsequently developed pain in his right foot at the base of the fifth metatarsal.  He rates the pain is a 5 out of 10, worse with standing or movement.  He has not had a specific injury but has been standing and walking excessively as noted above.  He has been taking ibuprofen 800 mg with partial relief of the pain.   Past Medical History:  Diagnosis Date  . Hypertension     Past Surgical History:  Procedure Laterality Date  . ABDOMINAL SURGERY    . COSMETIC SURGERY    . DUODENOTOMY      No family history on file.  Social History   Tobacco Use  . Smoking status: Never Smoker  . Smokeless tobacco: Never Used  Vaping Use  . Vaping Use: Never used  Substance Use Topics  . Alcohol use: Yes  . Drug use: No    Prior to Admission medications   Medication Sig Start Date End Date Taking? Authorizing Provider  lisinopril (PRINIVIL,ZESTRIL) 5 MG tablet Take 5 mg by mouth daily.    [provider]  Multiple Vitamins-Minerals (MULTIVITAMIN ADULT) TABS Take 1 tablet by mouth daily.    [provider]  naproxen (NAPROSYN) 375 MG tablet Take 1 tablet twice daily as needed for foot pain. 10/27/19   Alisan Dokes, MD  omeprazole (PRILOSEC) 20 MG capsule Take 1 capsule (20 mg total) by mouth daily. 02/17/16   Everlene Farrier, PA-C  polyethylene glycol powder (GLYCOLAX/MIRALAX) powder Take 17 g by mouth 2 (two) times daily. 02/17/16   Everlene Farrier, PA-C    Allergies Penicillins and Pineapple   REVIEW OF SYSTEMS  Negative except as noted here  or in the History of Present Illness.   PHYSICAL EXAMINATION  Initial Vital Signs Blood pressure 128/78, pulse 66, temperature 98.8 F (37.1 C), temperature source Oral, resp. rate 20, height 5\' 10"  (1.778 m), weight (!) 120.2 kg, SpO2 99 %.  Examination General: Well-developed, well-nourished male in no acute distress; appearance consistent with age of record HENT: normocephalic; atraumatic Eyes: Normal appearance Neck: supple Heart: regular rate and rhythm Lungs: clear to auscultation bilaterally Abdomen: soft; nondistended; nontender; bowel sounds present Extremities: No deformity; full range of motion; pulses normal; tenderness over base of right fifth metatarsal; trace edema of lower legs, right greater than left Neurologic: Awake, alert and oriented; motor function intact in all extremities and symmetric; no facial droop Skin: Warm and dry Psychiatric: Normal mood and affect   RESULTS  Summary of this visit's results, reviewed and interpreted by myself:   EKG Interpretation  Date/Time:    Ventricular Rate:    PR Interval:    QRS Duration:   QT Interval:    QTC Calculation:   R Axis:     Text Interpretation:        Laboratory Studies: No results found for this or any previous visit (from the past 24 hour(s)). Imaging Studies: DG Foot Complete Right  Result Date: 10/27/2019 CLINICAL DATA:  Right fifth metatarsal pain for 2  days after injury EXAM: RIGHT FOOT COMPLETE - 3+ VIEW COMPARISON:  None. FINDINGS: Frontal, oblique, and lateral views of the right foot are obtained. No fracture, subluxation, or dislocation. Joint spaces are well preserved. Minimal calcification in the soft tissue adjacent to the base of the fifth metatarsal. Mild soft tissue swelling within the lateral dorsal aspect of the forefoot. IMPRESSION: 1. Soft tissue swelling. 2. No acute bony abnormality. Electronically Signed   By: Sharlet Salina M.D.   On: 10/27/2019 00:55    ED COURSE and MDM    Nursing notes, initial and subsequent vitals signs, including pulse oximetry, reviewed and interpreted by myself.  Vitals:   10/26/19 2122 10/26/19 2124 10/27/19 0016  BP: 128/78  109/72  Pulse: 66  53  Resp: 20  20  Temp: 98.8 F (37.1 C)    TempSrc: Oral    SpO2: 99%  100%  Weight:  (!) 120.2 kg   Height:  5\' 10"  (1.778 m)    Medications  naproxen (NAPROSYN) tablet 500 mg (has no administration in time range)    No evidence of stress fracture on CT.  Patient advised to limit his walking and standing as best he can.  We will start him on naproxen.   PROCEDURES  Procedures   ED DIAGNOSES     ICD-10-CM   1. Acute pain of right foot  M79.671   2. Overuse injury  X50. , MD 10/27/19 [phone]

## 2019-11-23 ENCOUNTER — Other Ambulatory Visit: Payer: Self-pay

## 2019-11-23 ENCOUNTER — Emergency Department (HOSPITAL_BASED_OUTPATIENT_CLINIC_OR_DEPARTMENT_OTHER)
Admission: EM | Admit: 2019-11-23 | Discharge: 2019-11-23 | Disposition: A | Discharge status: Home / Self Care | Payer: BLUE CROSS/BLUE SHIELD | Attending: Emergency Medicine | Admitting: Emergency Medicine

## 2019-11-23 ENCOUNTER — Encounter (HOSPITAL_BASED_OUTPATIENT_CLINIC_OR_DEPARTMENT_OTHER): Payer: Self-pay | Admitting: *Deleted

## 2019-11-23 DIAGNOSIS — Z79899 Other long term (current) drug therapy: Secondary | ICD-10-CM | POA: Diagnosis not present

## 2019-11-23 DIAGNOSIS — J029 Acute pharyngitis, unspecified: Secondary | ICD-10-CM | POA: Insufficient documentation

## 2019-11-23 DIAGNOSIS — I10 Essential (primary) hypertension: Secondary | ICD-10-CM | POA: Insufficient documentation

## 2019-11-23 DIAGNOSIS — Z20822 Contact with and (suspected) exposure to covid-19: Secondary | ICD-10-CM | POA: Insufficient documentation

## 2019-11-23 LAB — SARS CORONAVIRUS 2 BY RT PCR (HOSPITAL ORDER, PERFORMED IN ~~LOC~~ HOSPITAL LAB): SARS Coronavirus 2: NEGATIVE

## 2019-11-23 LAB — GROUP A STREP BY PCR: Group A Strep by PCR: NOT DETECTED

## 2019-11-23 MED ORDER — AZITHROMYCIN 250 MG PO TABS: 250.0000 mg | ORAL_TABLET | Freq: Every day | ORAL | 0 refills | Status: AC

## 2019-11-23 MED FILL — AZITHROMYCIN 250 MG TABS: 250 | 5 days supply | Qty: 6 | Fill #0

## 2019-11-23 NOTE — ED Notes (Signed)
Pt verbalized understanding of discharge instructions and denies any further questions at this time.   

## 2019-11-23 NOTE — ED Triage Notes (Signed)
Fever last night. Sore throat this am. He has had Covid and does not feel like this is Covid. He has had a Covid shot.

## 2019-11-28 NOTE — ED Provider Notes (Signed)
[facility] EMERGENCY DEPARTMENT Provider Note   CSN: [id number] Arrival date & time: 11/23/19  1151     History Chief Complaint  Patient presents with  . Sore Throat    Brett Cannon is a 39 y.o. male.  HPI   39 year old male with sore throat.  Subjective fever last night.  Developed sore throat this morning.  Reports prior Covid as well as also been immunized.  Denies any other symptoms associated with this.  No cough.  No dyspnea.  No acute GI symptoms.  Past Medical History:  Diagnosis Date  . Hypertension     There are no problems to display for this patient.   Past Surgical History:  Procedure Laterality Date  . ABDOMINAL SURGERY    . COSMETIC SURGERY    . DUODENOTOMY         No family history on file.  Social History   Tobacco Use  . Smoking status: Never Smoker  . Smokeless tobacco: Never Used  Vaping Use  . Vaping Use: Never used  Substance Use Topics  . Alcohol use: Yes  . Drug use: No    Home Medications Prior to Admission medications   Medication Sig Start Date End Date Taking? Authorizing Provider  azithromycin (ZITHROMAX) 250 MG tablet Take 1 tablet (250 mg total) by mouth daily. Take first 2 tablets together, then 1 every day until finished. 11/23/19   Raeford Razor, MD  lisinopril (PRINIVIL,ZESTRIL) 5 MG tablet Take 5 mg by mouth daily.    [provider]  Multiple Vitamins-Minerals (MULTIVITAMIN ADULT) TABS Take 1 tablet by mouth daily.    [provider]  naproxen (NAPROSYN) 375 MG tablet Take 1 tablet twice daily as needed for foot pain. 10/27/19   Molpus, John, MD  omeprazole (PRILOSEC) 20 MG capsule Take 1 capsule (20 mg total) by mouth daily. 02/17/16   Everlene Farrier, PA-C  polyethylene glycol powder (GLYCOLAX/MIRALAX) powder Take 17 g by mouth 2 (two) times daily. 02/17/16   Everlene Farrier, PA-C    Allergies    Penicillins and Pineapple  Review of Systems   Review of Systems All systems reviewed  and negative, other than as noted in HPI.  Physical Exam Updated Vital Signs BP 125/70   Pulse 84   Temp 99 F (37.2 C) (Oral)   Resp 18   Ht 5\' 10"  (1.778 m)   Wt 117.9 kg   SpO2 96%   BMI 37.31 kg/m   Physical Exam Vitals and nursing note reviewed.  Constitutional:      General: He is not in acute distress.    Appearance: He is well-developed.  HENT:     Head: Normocephalic and atraumatic.     Mouth/Throat:     Comments: Tonsillitis/pharyngitis without exudate.  Uvula midline.  Normal sounding voice.  Neck supple. Eyes:     General:        Right eye: No discharge.        Left eye: No discharge.     Conjunctiva/sclera: Conjunctivae normal.  Cardiovascular:     Rate and Rhythm: Normal rate and regular rhythm.     Heart sounds: Normal heart sounds. No murmur heard.  No friction rub. No gallop.   Pulmonary:     Effort: Pulmonary effort is normal. No respiratory distress.     Breath sounds: Normal breath sounds.  Abdominal:     General: There is no distension.     Palpations: Abdomen is soft.     Tenderness:  There is no abdominal tenderness.  Musculoskeletal:        General: No tenderness.     Cervical back: Neck supple.  Skin:    General: Skin is warm and dry.  Neurological:     Mental Status: He is alert.  Psychiatric:        Behavior: Behavior normal.        Thought Content: Thought content normal.     ED Results / Procedures / Treatments   Labs (all labs ordered are listed, but only abnormal results are displayed) Labs Reviewed  GROUP A STREP BY PCR  SARS CORONAVIRUS 2 BY RT PCR (HOSPITAL ORDER, PERFORMED IN [facility])    EKG None  Radiology No results found.  Procedures Procedures (including critical care time)  Medications Ordered in ED Medications - No data to display  ED Course  I have reviewed the triage vital signs and the nursing notes.  Pertinent labs & imaging results that were available during my care of the patient  were reviewed by me and considered in my medical decision making (see chart for details).    MDM Rules/Calculators/A&P                          39 year old male with pharyngitis.  Afebrile.  Well-appearing.  No some concern for airway compromise at this time.  Strep negative.  Symptomatic treatment.  Patient is very concerned about possible bacterial infection.  I suspect likely not.  Provided prescription for azithromycin.  Advised to continue symptomatic treatment for the next couple days and not actually start taking the antibiotic unless the symptoms do not significantly improve.  Return precautions discussed.  Final Clinical Impression(s) / ED Diagnoses Final diagnoses:  Acute pharyngitis, unspecified etiology    Rx / DC Orders ED Discharge Orders         Ordered    azithromycin (ZITHROMAX) 250 MG tablet  Daily        11/23/19 1317           Raeford Razor, MD 11/28/19 1043

## 2021-05-08 IMAGING — DX DG FOOT COMPLETE 3+V*R*
3 series · 3 of 3 positions shown · non-contrast
Comparison: None.

CLINICAL DATA: Right fifth metatarsal pain for 2 days after injury

EXAM:
RIGHT FOOT COMPLETE - 3+ VIEW

[foot ap]
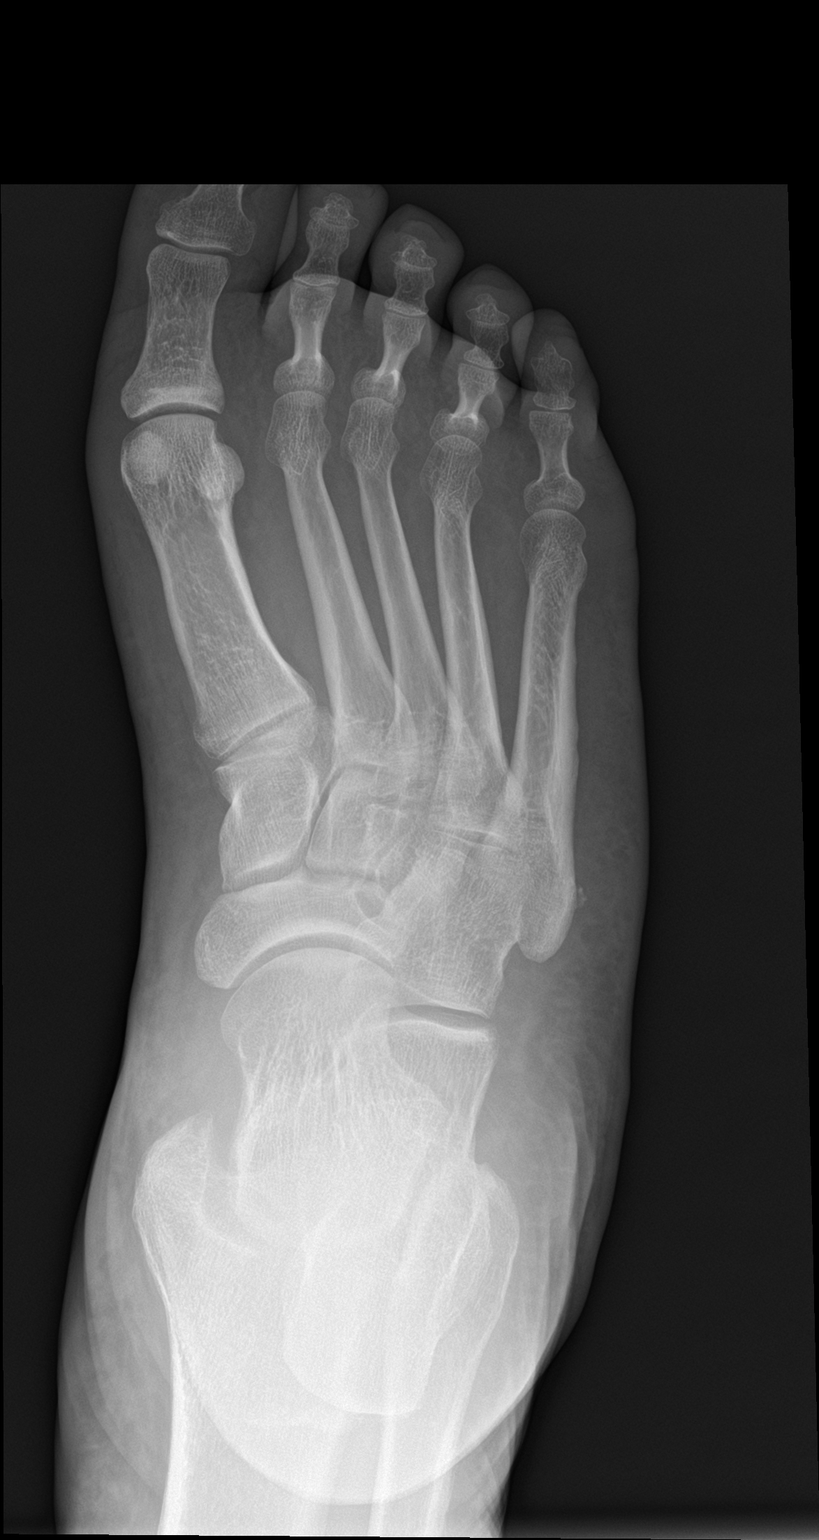

[foot obl]
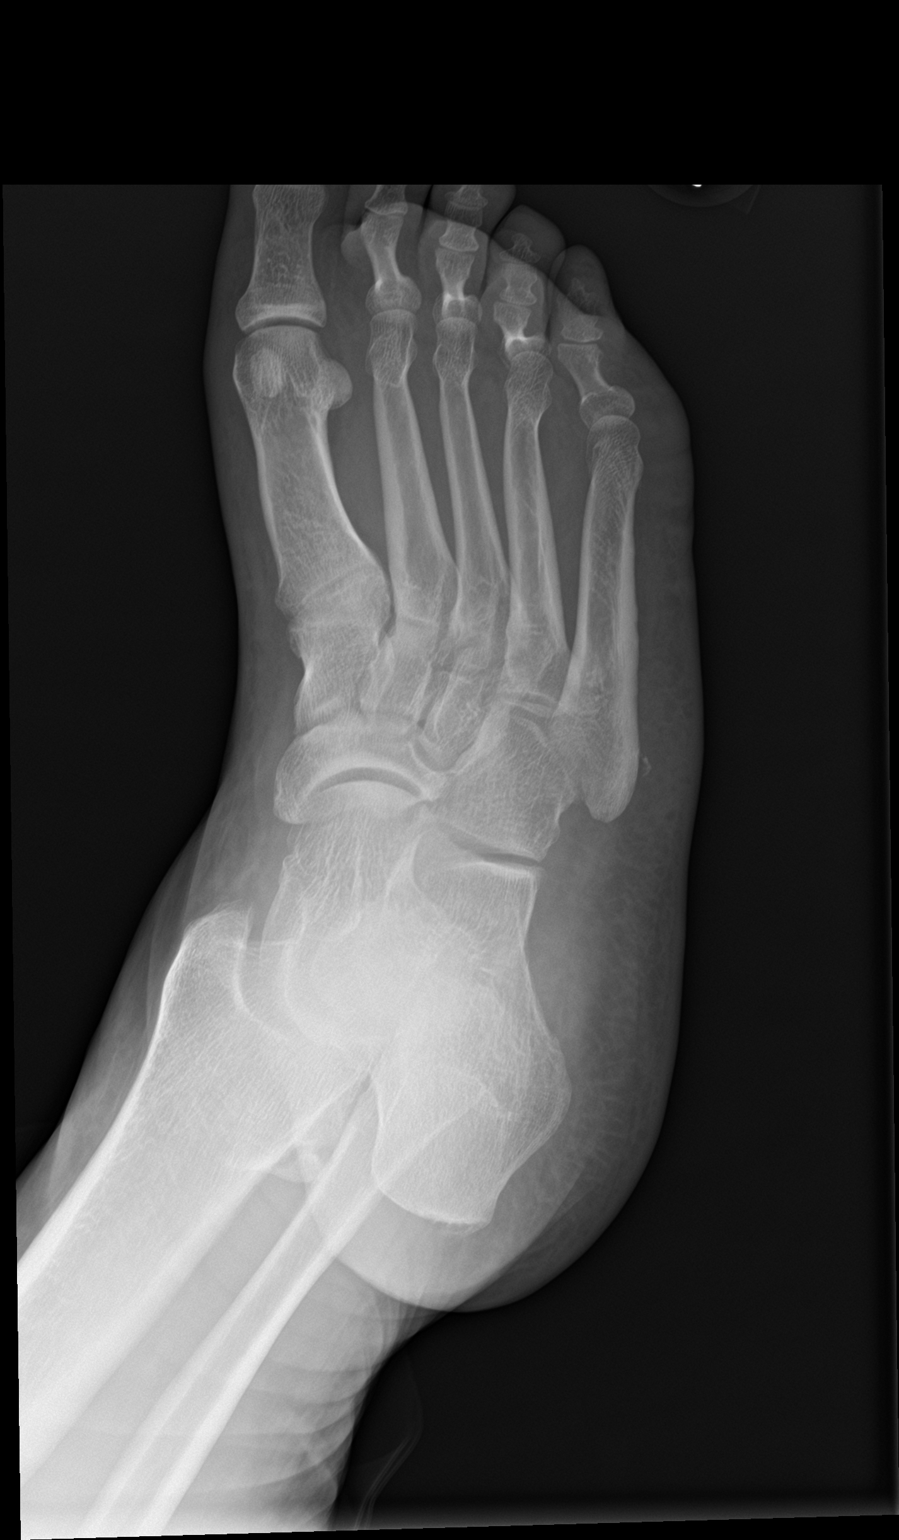

[foot lat]
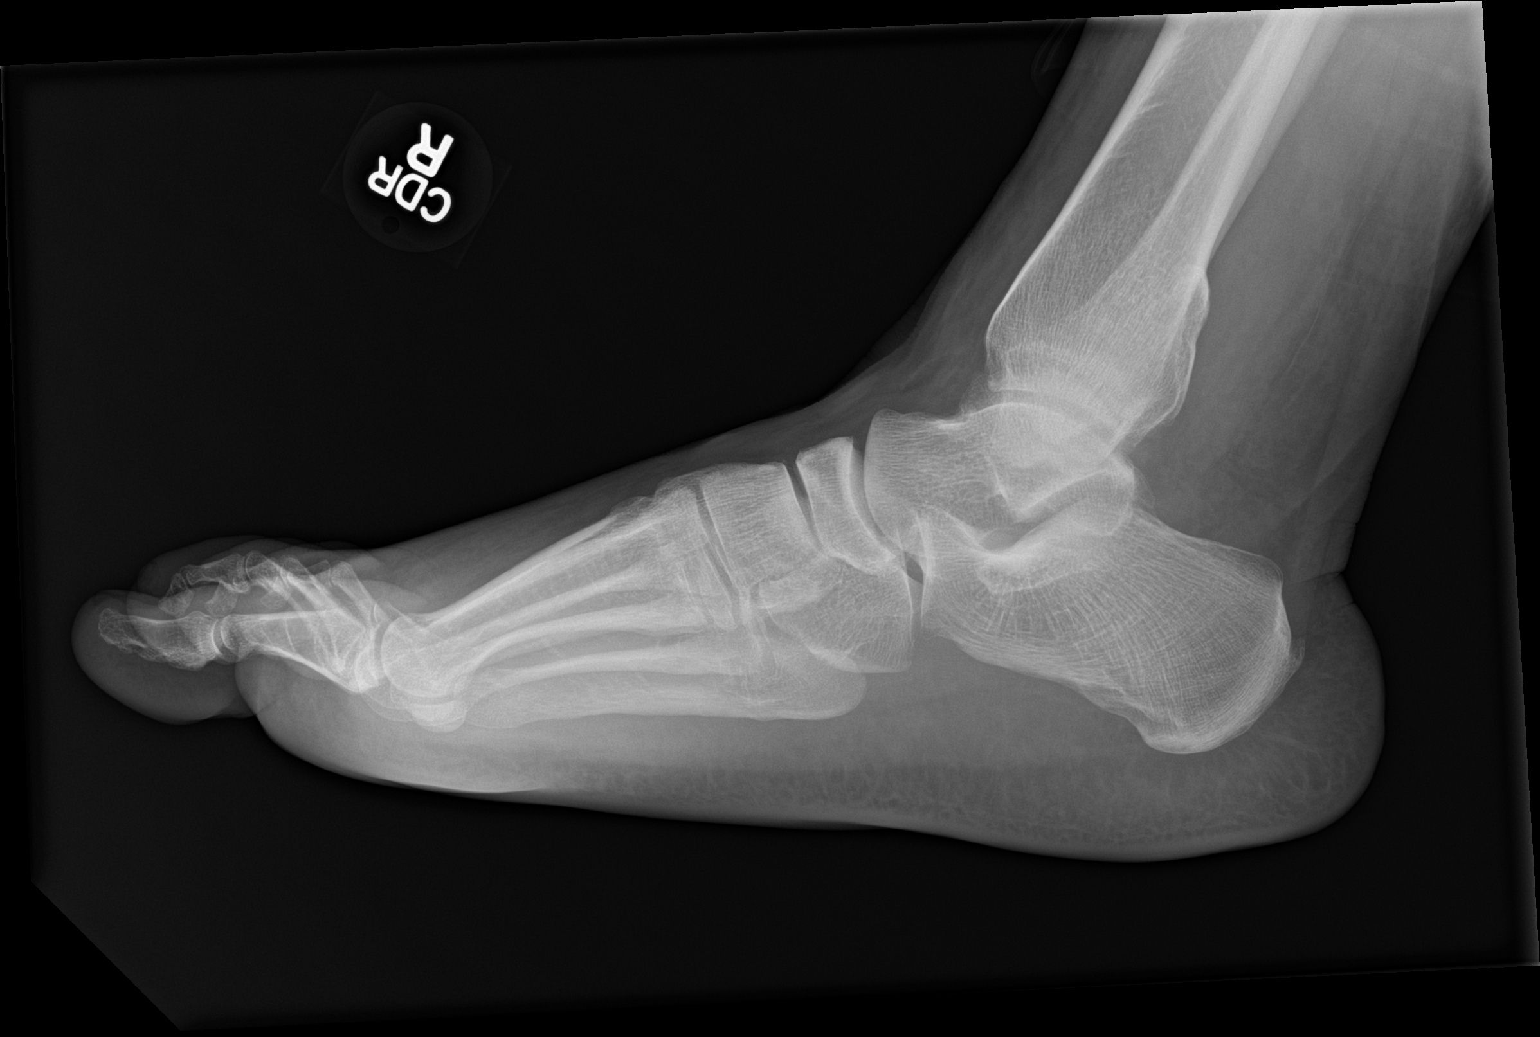

[3 of 3 positions shown; findings below may reference images not displayed]

FINDINGS: Frontal, oblique, and lateral views of the right foot are obtained.
No fracture, subluxation, or dislocation. Joint spaces are well
preserved. Minimal calcification in the soft tissue adjacent to the
base of the fifth metatarsal. Mild soft tissue swelling within the
lateral dorsal aspect of the forefoot.
IMPRESSION: 1. Soft tissue swelling.
2. No acute bony abnormality.

## 2023-11-12 ENCOUNTER — Encounter (HOSPITAL_BASED_OUTPATIENT_CLINIC_OR_DEPARTMENT_OTHER): Payer: Self-pay | Admitting: Emergency Medicine

## 2023-11-12 ENCOUNTER — Other Ambulatory Visit: Payer: Self-pay

## 2023-11-12 DIAGNOSIS — R112 Nausea with vomiting, unspecified: Secondary | ICD-10-CM | POA: Insufficient documentation

## 2023-11-12 DIAGNOSIS — R197 Diarrhea, unspecified: Secondary | ICD-10-CM | POA: Insufficient documentation

## 2023-11-12 DIAGNOSIS — R509 Fever, unspecified: Secondary | ICD-10-CM | POA: Insufficient documentation

## 2023-11-12 LAB — RESP PANEL BY RT-PCR (RSV, FLU A&B, COVID)  RVPGX2
Influenza A by PCR: NEGATIVE
Influenza B by PCR: NEGATIVE
Resp Syncytial Virus by PCR: NEGATIVE
SARS Coronavirus 2 by RT PCR: NEGATIVE

## 2023-11-12 MED ORDER — ACETAMINOPHEN 325 MG PO TABS
650.0000 mg | ORAL_TABLET | Freq: Once | ORAL | Status: AC
  Administered 2023-11-12: 650 mg via ORAL
  Filled 2023-11-12: qty 2

## 2023-11-12 NOTE — ED Triage Notes (Signed)
 Pt c/o diarrhea x 4d; felt feverish today

## 2023-11-13 ENCOUNTER — Emergency Department (HOSPITAL_BASED_OUTPATIENT_CLINIC_OR_DEPARTMENT_OTHER)
Admission: EM | Admit: 2023-11-13 | Discharge: 2023-11-13 | Disposition: A | Discharge status: Home / Self Care | Payer: Self-pay | Attending: Emergency Medicine | Admitting: Emergency Medicine

## 2023-11-13 DIAGNOSIS — R509 Fever, unspecified: Secondary | ICD-10-CM

## 2023-11-13 DIAGNOSIS — R112 Nausea with vomiting, unspecified: Secondary | ICD-10-CM

## 2023-11-13 LAB — CBC WITH DIFFERENTIAL/PLATELET
Abs Immature Granulocytes: 0.05 K/uL (ref 0.00–0.07)
Basophils Absolute: 0 K/uL (ref 0.0–0.1)
Basophils Relative: 1 %
Eosinophils Absolute: 0.1 K/uL (ref 0.0–0.5)
Eosinophils Relative: 1 %
HCT: 37.6 % — ABNORMAL LOW (ref 39.0–52.0)
Hemoglobin: 12.5 g/dL — ABNORMAL LOW (ref 13.0–17.0)
Immature Granulocytes: 1 %
Lymphocytes Relative: 23 %
Lymphs Abs: 1 K/uL (ref 0.7–4.0)
MCH: 29.5 pg (ref 26.0–34.0)
MCHC: 33.2 g/dL (ref 30.0–36.0)
MCV: 88.7 fL (ref 80.0–100.0)
Monocytes Absolute: 1.4 K/uL — ABNORMAL HIGH (ref 0.1–1.0)
Monocytes Relative: 31 %
Neutro Abs: 1.9 K/uL (ref 1.7–7.7)
Neutrophils Relative %: 43 %
Platelets: 188 K/uL (ref 150–400)
RBC: 4.24 MIL/uL (ref 4.22–5.81)
RDW: 14.1 % (ref 11.5–15.5)
Smear Review: NORMAL
WBC: 4.4 K/uL (ref 4.0–10.5)
nRBC: 0 % (ref 0.0–0.2)

## 2023-11-13 LAB — LIPASE, BLOOD: Lipase: 34 U/L (ref 11–51)

## 2023-11-13 LAB — COMPREHENSIVE METABOLIC PANEL WITH GFR
ALT: 34 U/L (ref 0–44)
AST: 40 U/L (ref 15–41)
Albumin: 3.6 g/dL (ref 3.5–5.0)
Alkaline Phosphatase: 60 U/L (ref 38–126)
Anion gap: 12 (ref 5–15)
BUN: 8 mg/dL (ref 6–20)
CO2: 26 mmol/L (ref 22–32)
Calcium: 8.2 mg/dL — ABNORMAL LOW (ref 8.9–10.3)
Chloride: 101 mmol/L (ref 98–111)
Creatinine, Ser: 0.84 mg/dL (ref 0.61–1.24)
GFR, Estimated: >60 mL/min (ref >60)
Glucose, Bld: 95 mg/dL (ref 70–99)
Potassium: 2.7 mmol/L — CL (ref 3.5–5.1)
Sodium: 139 mmol/L (ref 135–145)
Total Bilirubin: 0.9 mg/dL (ref 0.0–1.2)
Total Protein: 6.6 g/dL (ref 6.5–8.1)

## 2023-11-13 LAB — MAGNESIUM: Magnesium: 2.4 mg/dL (ref 1.7–2.4)

## 2023-11-13 MED ORDER — ONDANSETRON HCL 4 MG/2ML IJ SOLN: 4.0000 mg | Freq: Once | INTRAMUSCULAR | Status: DC

## 2023-11-13 MED ORDER — POTASSIUM CHLORIDE 10 MEQ/100ML IV SOLN
10.0000 meq | INTRAVENOUS | Status: AC
  Administered 2023-11-13 (×2): 10 meq via INTRAVENOUS
  Filled 2023-11-13 (×2): qty 100

## 2023-11-13 MED ORDER — POTASSIUM CHLORIDE CRYS ER 20 MEQ PO TBCR: 40.0000 meq | EXTENDED_RELEASE_TABLET | Freq: Two times a day (BID) | ORAL | Status: DC

## 2023-11-13 MED ORDER — LACTATED RINGERS IV BOLUS
1000.0000 mL | Freq: Once | INTRAVENOUS | Status: AC
  Administered 2023-11-13: 1000 mL via INTRAVENOUS

## 2023-11-13 MED ORDER — POTASSIUM CHLORIDE CRYS ER 20 MEQ PO TBCR: 20.0000 meq | EXTENDED_RELEASE_TABLET | Freq: Every day | ORAL | 0 refills | Status: AC

## 2023-11-13 NOTE — ED Notes (Addendum)
 Pt. Reports feeling feverish earlier tonight. Pt. Did not take his temp. at home. Upon assessment pt. Is afebrile. Pt. Has no other complaints.

## 2023-11-13 NOTE — ED Provider Notes (Signed)
 [place] EMERGENCY DEPARTMENT AT [facility] Provider Note   CSN: [id number] Arrival date & time: 11/12/23  2149     History Chief Complaint  Patient presents with   Fever    HPI Brett Cannon is a 43 y.o. male presenting for chief complaint of nausea and diarrhea and fever. Ate chicken that had warmed up and pretty soon after had multiple stools N/V/D for 2 days Fevers today No other symptoms Tylenol  on arrival with improvement. No URI symptoms Hx of bariatric surgery  Patient's recorded medical, surgical, social, medication list and allergies were reviewed in the Snapshot window as part of the initial history.   Review of Systems   Review of Systems  Constitutional:  Positive for fever. Negative for chills.  HENT:  Negative for ear pain and sore throat.   Eyes:  Negative for pain and visual disturbance.  Respiratory:  Negative for cough and shortness of breath.   Cardiovascular:  Negative for chest pain and palpitations.  Gastrointestinal:  Positive for diarrhea, nausea and vomiting. Negative for abdominal pain.  Genitourinary:  Negative for dysuria and hematuria.  Musculoskeletal:  Negative for arthralgias and back pain.  Skin:  Negative for color change and rash.  Neurological:  Negative for seizures and syncope.  All other systems reviewed and are negative.   Physical Exam Updated Vital Signs BP 110/64 (BP Location: Left Arm)   Pulse 68   Temp 98.4 F (36.9 C) (Oral)   Resp 16   Ht 5' 10 (1.778 m)   Wt (!) 145.2 kg   SpO2 97%   BMI 45.92 kg/m  Physical Exam Vitals and nursing note reviewed.  Constitutional:      General: He is not in acute distress.    Appearance: He is well-developed.  HENT:     Head: Normocephalic and atraumatic.  Eyes:     Conjunctiva/sclera: Conjunctivae normal.  Cardiovascular:     Rate and Rhythm: Normal rate and regular rhythm.     Heart sounds: No murmur heard. Pulmonary:     Effort: Pulmonary effort is  normal. No respiratory distress.     Breath sounds: Normal breath sounds.  Abdominal:     Palpations: Abdomen is soft.     Tenderness: There is no abdominal tenderness.  Musculoskeletal:        General: No swelling.     Cervical back: Neck supple.  Skin:    General: Skin is warm and dry.     Capillary Refill: Capillary refill takes less than 2 seconds.  Neurological:     Mental Status: He is alert.  Psychiatric:        Mood and Affect: Mood normal.      ED Course/ Medical Decision Making/ A&P    Procedures Procedures   Medications Ordered in ED Medications  ondansetron  (ZOFRAN ) injection 4 mg (0 mg Intravenous Hold 11/13/23 0259)  potassium chloride  10 mEq in 100 mL IVPB (10 mEq Intravenous New Bag/Given 11/13/23 0410)  potassium chloride  SA (KLOR-CON  M) CR tablet 40 mEq (has no administration in time range)  acetaminophen  (TYLENOL ) tablet 650 mg (650 mg Oral Given 11/12/23 2209)  lactated ringers  bolus 1,000 mL (0 mLs Intravenous Stopped 11/13/23 0404)    Medical Decision Making:   This 43 year old male with an expansive medical history presenting with fever today. His history of present on [place] and findings are most consistent with gastroenteritis given constellation of fevers, nausea vomiting and diarrhea altogether after eating leftover food.  He did  not have the bloody diarrhea I would anticipate as diagnostic of Salmonella.  Also the timeline of illness is inconsistent with Salmonella.  He was sick within 12 hours of eating the food while I would expect a incubation period for bacterial illness.  Additionally, the diarrhea nausea vomiting symptoms are gradually improving. He has no other localizing signs or symptoms.  Clinically patient does not meet SIRS criteria and does not appear to have bacteremia.  He is overall feeling quite well at this time.  Will perform lab work to evaluate for acute metabolic crisis or evidence of severe infection.  Lab work performed and  reviewed and demonstrate a hypokalemia.  Patient states he has a history of similar secondary to bariatric surgery. Will replace his potassium and reassess his symptoms.   Reassessment: Patient stated he was completely symptomatically resolved on serial assessment.  Fever still not present.  He is ambulatory after a 7-hour observation window able to tolerate p.o. intake and feels comfortable with outpatient care management follow-up with PCP.  Strict return precautions regarding development of nausea vomiting or worsening symptoms reinforced and patient expressed understanding.  Clinical Impression:  1. Fever, unspecified fever cause   2. Nausea and vomiting, unspecified vomiting type      Discharge   Final Clinical Impression(s) / ED Diagnoses Final diagnoses:  Fever, unspecified fever cause  Nausea and vomiting, unspecified vomiting type    Rx / DC Orders ED Discharge Orders          Ordered    potassium chloride  SA (KLOR-CON  M) 20 MEQ tablet  Daily        11/13/23 0442              Jerral Meth, MD 11/13/23 [phone]
